# Patient Record
Sex: Female | Born: 2000 | Race: Black or African American | Hispanic: No | Marital: Single | Smoking: Never smoker
Health system: Southern US, Community
[De-identification: ages and names within clinical notes are randomized; demographics above are authoritative.]

## PROBLEM LIST (undated history)

## (undated) DIAGNOSIS — F10929 Alcohol use, unspecified with intoxication, unspecified: Secondary | ICD-10-CM

## (undated) DIAGNOSIS — L309 Dermatitis, unspecified: Secondary | ICD-10-CM

## (undated) HISTORY — DX: Alcohol use, unspecified with intoxication, unspecified: F10.929

## (undated) HISTORY — DX: Dermatitis, unspecified: L30.9

---
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
## 2020-03-11 ENCOUNTER — Telehealth: Payer: Self-pay | Admitting: General Practice

## 2020-03-11 NOTE — Telephone Encounter (Signed)
Dismissal letter in guarantor snapshot  °
